# Patient Record
Sex: Female | Born: 1975 | Race: White | Hispanic: No | Marital: Married | Smoking: Never smoker
Health system: Southern US, Community
[De-identification: ages and names within clinical notes are randomized; demographics above are authoritative.]

## PROBLEM LIST (undated history)

## (undated) DIAGNOSIS — G43909 Migraine, unspecified, not intractable, without status migrainosus: Secondary | ICD-10-CM

## (undated) DIAGNOSIS — F419 Anxiety disorder, unspecified: Secondary | ICD-10-CM

## (undated) HISTORY — DX: Migraine, unspecified, not intractable, without status migrainosus: G43.909

## (undated) HISTORY — PX: ABDOMINAL HYSTERECTOMY: SHX81

## (undated) HISTORY — DX: Anxiety disorder, unspecified: F41.9

---

## 2004-09-11 ENCOUNTER — Observation Stay (HOSPITAL_COMMUNITY): Admission: RE | Admit: 2004-09-11 | Discharge: 2004-09-12 | Payer: Self-pay | Admitting: Gynecology

## 2004-09-11 ENCOUNTER — Encounter (INDEPENDENT_AMBULATORY_CARE_PROVIDER_SITE_OTHER): Payer: Self-pay | Admitting: Specialist

## 2005-02-06 ENCOUNTER — Emergency Department: Payer: Self-pay | Admitting: Emergency Medicine

## 2005-03-11 ENCOUNTER — Ambulatory Visit: Payer: Self-pay | Admitting: Ophthalmology

## 2005-03-25 ENCOUNTER — Ambulatory Visit: Payer: Self-pay | Admitting: Ophthalmology

## 2005-04-07 ENCOUNTER — Emergency Department: Payer: Self-pay | Admitting: General Practice

## 2005-05-05 ENCOUNTER — Emergency Department: Payer: Self-pay | Admitting: Emergency Medicine

## 2005-08-13 ENCOUNTER — Ambulatory Visit: Payer: Self-pay | Admitting: Gynecology

## 2006-03-14 ENCOUNTER — Ambulatory Visit: Payer: Self-pay | Admitting: Gynecology

## 2008-07-03 ENCOUNTER — Emergency Department: Payer: Self-pay | Admitting: Emergency Medicine

## 2008-07-04 ENCOUNTER — Emergency Department: Payer: Self-pay

## 2008-11-18 ENCOUNTER — Emergency Department: Payer: Self-pay | Admitting: Emergency Medicine

## 2009-05-20 ENCOUNTER — Ambulatory Visit: Payer: Self-pay | Admitting: Obstetrics and Gynecology

## 2009-05-20 ENCOUNTER — Other Ambulatory Visit: Admission: RE | Admit: 2009-05-20 | Discharge: 2009-05-20 | Payer: Self-pay | Admitting: Obstetrics and Gynecology

## 2009-05-20 LAB — CONVERTED CEMR LAB
ALT: 17 units/L (ref 0–35)
AST: 19 units/L (ref 0–37)
Albumin: 4.6 g/dL (ref 3.5–5.2)
Alkaline Phosphatase: 59 units/L (ref 39–117)
MCHC: 34.3 g/dL (ref 30.0–36.0)
Platelets: 282 10*3/uL (ref 150–400)
Potassium: 4.6 meq/L (ref 3.5–5.3)
RDW: 12.4 % (ref 11.5–15.5)
Sodium: 140 meq/L (ref 135–145)
Total Protein: 7.8 g/dL (ref 6.0–8.3)
Trich, Wet Prep: NONE SEEN

## 2009-10-30 ENCOUNTER — Ambulatory Visit: Payer: Self-pay | Admitting: Obstetrics & Gynecology

## 2009-10-31 ENCOUNTER — Encounter: Payer: Self-pay | Admitting: Obstetrics & Gynecology

## 2009-11-17 ENCOUNTER — Ambulatory Visit: Payer: Self-pay | Admitting: Obstetrics and Gynecology

## 2010-05-19 NOTE — Assessment & Plan Note (Signed)
NAMEHILLARIE, HARRIGAN NO.:  [id number]   MEDICAL RECORD NO.:  [id number]          PATIENT TYPE:  POB   LOCATION:  [facility] at [facility]         FACILITY:  [facility]   PHYSICIAN:  Argentina Donovan, MD        DATE OF BIRTH:  [birth year]   DATE OF SERVICE:  05/20/2009                                  CLINIC NOTE   The patient is a 35 year old Caucasian female gravida 3, para 3-0-0-3  who over a year ago lost her job when the company moved down the country  and she started going to [employer] school.  She has one more year left.  She is doing well, but she has some complaints of weight gain over the  past 2 years of 27 pounds.  Her current weight 162.  Intermittent  generalized joint pain, fatigue, and leg cramps at night.  She states  she does have some mild varicose veins, but mainly spider veins, has not  had any blood work in some time.   Review of systems are as above.   The patient has had a total vaginal hysterectomy back in 2006.   PHYSICAL EXAMINATION:  VITAL SIGNS:  Her blood pressure is 128/90, pulse  81 per minute, her weight is 162, and she is 5 feet 6 inches tall.  GENERAL:  Well-developed, well-nourished white female in no acute  distress.  HEENT:  PERRLA, normocephalic.  NECK:  Supple.  Thyroid, symmetrical.  No dominant mass.  BACK:  Erect.  LUNGS:  Clear to auscultation and percussion.  HEART:  No murmur.  Normal sinus rhythm.  BREASTS:  Symmetrical.  No dominant masses.  No nipple discharge.  No  supraclavicular or axillary nodes.  ABDOMEN:  Soft, flat, nontender.  No masses.  No organomegaly.  EXTREMITIES:  Multiple areas of spider veins, but no large varicosities  noted.  No edema.  NEUROLOGIC:  DTRs within normal limits.  MUSCULOSKELETAL:  Joints, no sign of any chronic joint disease on  physical examination.  PELVIC:  Genitalia, external, normal.  BUS within normal limits.  Vagina  is clean and well rugated.  The vagina is status hysterectomy with  heavy  white discharge.  Wet prep was taken.  Bimanual pelvic examination  failed to be able to outline the ovaries, but to the apex of vagina felt  free.  No masses palpable within the cavity.   IMPRESSION AND PLAN:  Normal physical examination.  CMET and TSH.  Fatigue, recurrent joint pain, leg cramps nightly.           ______________________________  Argentina Donovan, MD     PR/MEDQ  D:  05/20/2009  T:  05/21/2009  Job:  [id number]

## 2010-05-19 NOTE — Assessment & Plan Note (Signed)
Brenda Park, Brenda Park             ACCOUNT NO.:  [id number]   MEDICAL RECORD NO.:  [id number]          PATIENT TYPE:  POB   LOCATION:  [facility] at [facility]         FACILITY:  [facility]   PHYSICIAN:  Jaynie Collins, MD     DATE OF BIRTH:  1975-09-24   DATE OF SERVICE:  11/17/2009                                  CLINIC NOTE   REASON FOR VISIT:  Pelvic pain.   The patient is a 35 year old, gravida 4, para 3-0-1-3 who was last seen  in October 30, 2009, complaining of low back pain and vaginal itching  with discharge.  A UA that was done at the nurse visit was remarkable  for trace blood, trace leukocyte and she was presumptively treated with  Bactrim double strength twice a day for 3 days and also a Diflucan  medicine for presumptive yeast infection.  The patient does say that the  yeast infection or the vaginal discharge has gotten better.  However,  her pelvic pain and lower back pain has gotten worse.  She describes it  has been on her left lower quadrant radiating to the left flank area.  She says that the pain is sharp and intense and sometimes makes it hard  for her to sleep at night.  She sometimes says that this pain is  accompanied by some pressure.  The patient is very concerned about this  coming from her left ovary.  Of note, she did have a total vaginal  hysterectomy back in 2006.  The patient does want evaluation for this  pain.  The indication for her hysterectomy was menometrorrhagia.   PHYSICAL EXAMINATION:  VITAL SIGNS:  Blood pressure was 152/93, pulse  68, weight 168 pounds, height 5 feet 6 inches.  GENERAL:  No apparent distress.  ABDOMEN:  Soft.  Mild tenderness to palpation in the left lower  quadrant.  No rebound or guarding.  No suprapubic tenderness.  Mild left  upper quadrant and flank tenderness.  No tenderness on her right side.  No organomegaly.  PELVIC:  Normal external female genitalia.  Pink and well rugated  vagina.  The patient does have a significant  amount of thick white  discharge and the sample was taken for wet prep.  No lesions seen on  bimanual exam.  Adnexa were unable to be palpated, but there was no  tenderness on examination.  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   Urine culture that was done on October 30, 2009, came back negative.   IMPRESSION:  Pelvic pain of unknown etiology.   PLAN:  The patient was told that given the pattern of her pain is very  unlikely that it is from her left ovary, but a pelvic ultrasound will be  done just to rule out any structural anomalies.  Her pain could be  musculoskeletal in origin versus being of gastrointestinal origin.  She  was told that we will follow up with the results of the pelvic  ultrasound and manage accordingly.  We will also follow up with the  results of her wet prep for now.  She was given a prescription for  diclofenac DR 75 kg p.o. b.i.d. p.r.n.  pain and she will come back for  the results of her pelvic  ultrasound.  We will also continue to keep an eye on her blood pressure  as of now and it is possible that it is elevated today secondary to her  pain and she has not had a history of high blood pressure in the past.           ______________________________  Jaynie Collins, MD     UA/MEDQ  D:  11/17/2009  T:  11/18/2009  Job:  [id number]

## 2010-05-22 NOTE — Op Note (Signed)
[name], Brenda Park             ACCOUNT NO.:  [id number]   MEDICAL RECORD NO.:  [id number]          PATIENT TYPE:  OBV   LOCATION:  9399                          FACILITY:  [facility]   PHYSICIAN:  Ginger Carne, MD  DATE OF BIRTH:  July 04, 1975   DATE OF PROCEDURE:  09/11/2004  DATE OF DISCHARGE:                                 OPERATIVE REPORT   PREOPERATIVE DIAGNOSIS:  Menometrorrhagia.   POSTOPERATIVE DIAGNOSIS:  Menometrorrhagia.   PROCEDURE:  Total vaginal hysterectomy with preservation of both tubes and  ovaries.   SURGEON:  Ginger Carne, M.D.   ASSISTANT:  None.   COMPLICATIONS:  None immediate.   ESTIMATED BLOOD LOSS:  Less than 75 mL.   SPECIMEN:  Uterus and cervix.   OPERATIVE FINDINGS:  Uterus about 8 weeks in size, globular. Both tubes and  ovaries bilaterally appeared normal.   OPERATIVE PROCEDURE:  The patient prepped and draped in usual fashion and  placed in lithotomy position. Betadine solution used for antiseptic and the  patient was catheterized prior to procedure. After adequate general  anesthesia a double tooth tenaculum placed on the anterior and posterior  leaves of the cervix. 2 cm of anterior and posterior vaginal epithelium were  incised transversely and following this the posterior and anterior  peritoneal reflections were identified and opened without injury to their  respective organs. Afterwards the uterosacral cardinal ligament complexes  were clamped, cut, and ligated with 0 Vicryl suture. This was then followed  by clamp, cut and ligation of the uterine vasculature with its ascending  branches. The round ligaments were taken separately and the utero-ovarian  ligaments clamped, cut and ligated with transfixation 0 Vicryl sutures.  Bleeding points hemostatically checked. Blood clots removed from the  abdomen. Closure of the cuff in one layer of 0 Vicryl running interlocking  suture. Urine clear at the end of the procedure. The patient  tolerated  procedure well and returned to post anesthesia recovery room in excellent  condition.      Ginger Carne, MD  Electronically Signed     SHB/MEDQ  D:  09/11/2004  T:  09/11/2004  Job:  [id number]

## 2010-05-22 NOTE — H&P (Signed)
Brenda Park, Brenda Park             ACCOUNT NO.:  [id number]   MEDICAL RECORD NO.:  [id number]          PATIENT TYPE:  AMB   LOCATION:  [facility]                           FACILITY:  [facility]   PHYSICIAN:  Ginger Carne, MD  DATE OF BIRTH:  Jun 07, 1975   DATE OF ADMISSION:  DATE OF DISCHARGE:                                HISTORY & PHYSICAL   PROCEDURE:  Total vaginal hysterectomy with preservation of both tubes and  ovaries.   REASON FOR HOSPITALIZATION:  Adenomyosis, menometrorrhagia.   HISTORY OF PRESENT ILLNESS:  This patient is a 35 year old, gravida 4, para  3-0-1-3, Caucasian female admitted for a total vaginal hysterectomy with  preservation of both tubes and ovaries because of menometrorrhagia.  The  patient's menses are anywhere between 8-14 days in length, 28-30 days apart.  In the past, the patient has had two different types of intrauterine devices  to control bleeding (ParaGard and Tubac without benefit).  She has been  unable to utilize several different forms of oral contraceptives in the past  because of nausea, mood swings, and headaches.  The last oral contraceptive  agent (Estrostep) caused similar symptoms.  Provera 30 mg daily during the  course of her menses and Norisodrine acetate 5 mg have been used with  minimal benefit.  The patient has significant cramping associated with said  bleeding.  She takes no medications to enhance her bleeding propensity and  has no personal or family history of bleeding disorders.  Transvaginal  ultrasound reveals no evidence for fibroids.  SIS demonstrates no evidence  for intracavitary lesions.  The posterior aspect of the uterus is enlarged  suggestive of adenomyosis.  An MRI was deferred due to cost.  Endometrial  biopsy reveals no evidence of neoplasia or hyperplasia.  The patient has  also attempted the use of Prometrium without benefit to control her menses.   OBSTETRICAL/GYNECOLOGIC HISTORY:  The patient has had one  termination in the  past and three vaginal deliveries between 1991 and 2001.   ALLERGIES:  None.   CURRENT MEDICATIONS:  None.   MEDICAL HISTORY:  Negative.   SURGICAL HISTORY:  Negative.   SOCIAL HISTORY:  The patient is a nonsmoker.  Denies illicit drug abuse or  alcohol abuse.   FAMILY HISTORY:  Noncontributory.   REVIEW OF SYSTEMS:  Noncontributory.   PHYSICAL EXAMINATION:  VITAL SIGNS:  Height 5 foot 5 inches, weight 143  pounds, blood pressure 122/78.  HEENT:  Grossly normal.  BREAST:  Without masses, discharge, thickening, or tenderness bilaterally.  CHEST:  Clear to percussion auscultation.  CARDIOVASCULAR:  Without murmurs or enlargements.  Regular rate and rhythm.  VASCULAR:  Within normal limits.  EXTREMITIES:  Within normal limits.  LYMPHATICS:  Within normal limits.  SKIN:  Within normal limits.  NEUROLOGIC:  Within normal limits.  ABDOMEN:  Soft without gross hepatosplenomegaly.  PELVIC:  External genitalia, vulva, and vagina normal.  Cervix smooth  without erosions or lesions.  Uterus is upper limits of normal size.  Both  adnexa are palpable and found to be normal.  RECTAL:  Hemoccult negative without masses.  Pap smear is normal.   IMPRESSION:  Menometrorrhagia.   PLAN:  Total vaginal hysterectomy.  The patient is unable to tolerate  hormonal management for her abnormal bleeding.  The bleeding has had a  negative impact on her quality of life.  She has no desire for further  childbearing and understands that said hysterectomy will render her sterile.  She has no correctable anatomic or endocrine factors. Risks including  injuries to ureter, bowel, and bladder, possible conversion to laparoscopic  or open procedure, hemorrhage possibly requiring a blood transfusion,  infection, postoperative pulmonary complications and other unforeseen  complications were discussed with the patient.  All questions answered to  the satisfaction of said patient and all  instructions understood.      Ginger Carne, MD  Electronically Signed     SHB/MEDQ  D:  09/09/2004  T:  09/09/2004  Job:  [id number]

## 2010-05-22 NOTE — Discharge Summary (Signed)
[name], Brenda Park             ACCOUNT NO.:  [id number]   MEDICAL RECORD NO.:  [id number]          PATIENT TYPE:  OBV   LOCATION:  9303                          FACILITY:  [facility]   PHYSICIAN:  Ginger Carne, MD  DATE OF BIRTH:  1975/04/30   DATE OF ADMISSION:  09/11/2004  DATE OF DISCHARGE:  09/12/2004                                 DISCHARGE SUMMARY   REASON FOR HOSPITALIZATION:  Menometrorrhagia.   INHOSPITAL PROCEDURES:  Total vaginal hysterectomy with preservation of both  tubes and ovaries.   FINAL DIAGNOSIS:  Menometrorrhagia.   INHOSPITAL COURSE:  This is a 35 year old multiparous Caucasian female who  underwent the aforementioned procedures for menometrorrhagia on September 11, 2004.  Intraoperative course was uneventful.  Postoperatively she was  afebrile, voided well, incision dry, calves without tenderness, abdomen  soft, lungs clear, minimal vaginal drainage.  Postoperative hemoglobin 10.8  and hematocrit 31.6.  Patient was given routine postoperative instructions  including contacting the office for temperature elevation above 100.4  degrees Fahrenheit, increasing vaginal drainage, abdominal pain,  constipation or difficulty voiding.  Patient will be seen back in 4 weeks.  Dilaudid 1-2 mg every 4-6 hours prescribed to said patient.      Ginger Carne, MD  Electronically Signed     SHB/MEDQ  D:  09/12/2004  T:  09/12/2004  Job:  [id number]

## 2011-08-11 ENCOUNTER — Ambulatory Visit: Payer: Self-pay | Admitting: Obstetrics & Gynecology

## 2011-08-11 DIAGNOSIS — Z01419 Encounter for gynecological examination (general) (routine) without abnormal findings: Secondary | ICD-10-CM

## 2012-01-07 ENCOUNTER — Ambulatory Visit: Payer: Self-pay | Admitting: Obstetrics and Gynecology

## 2013-04-12 IMAGING — US ULTRASOUND RIGHT BREAST
1 series · 4 of 4 positions shown · non-contrast
Comparison: none

REASON FOR EXAM: av rt lobulated nodule OUTSIDE
COMMENTS:

PROCEDURE:     US  - US BREAST RIGHT  - January 07, 2012  [DATE]
RESULT:     Focus right breast ultrasound dated 01/07/2012.

[Series 1: ultrasound right breast · 0.08mm/px · 4 of 4 slices shown]
[im 1/4]
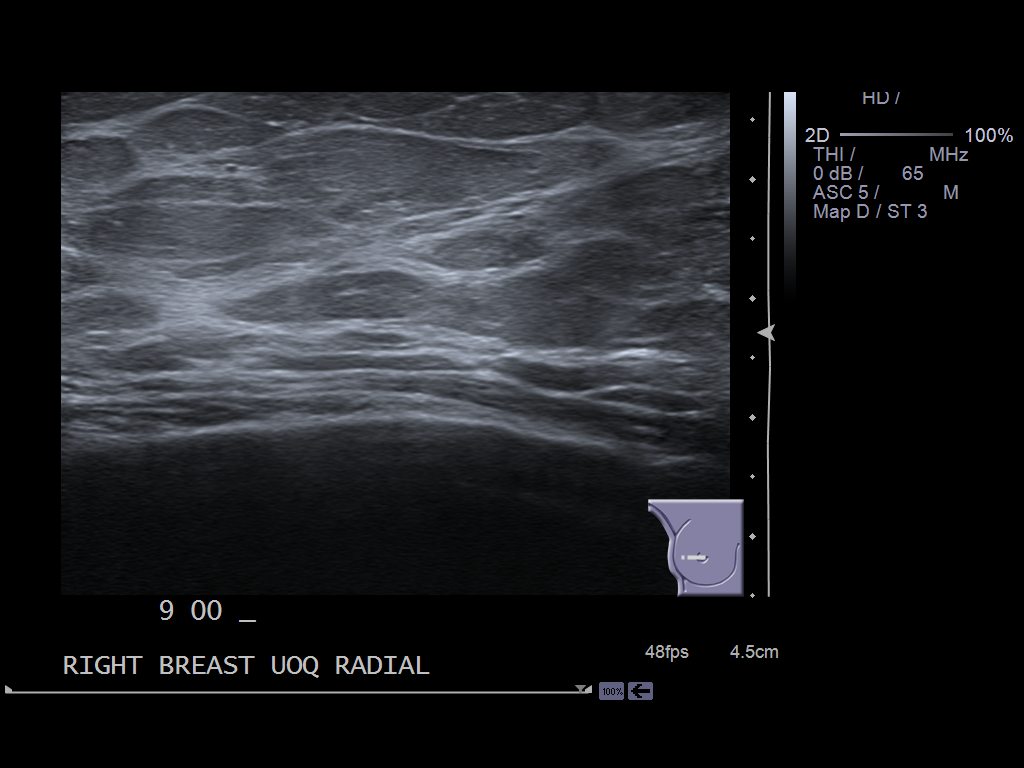
[im 2/4]
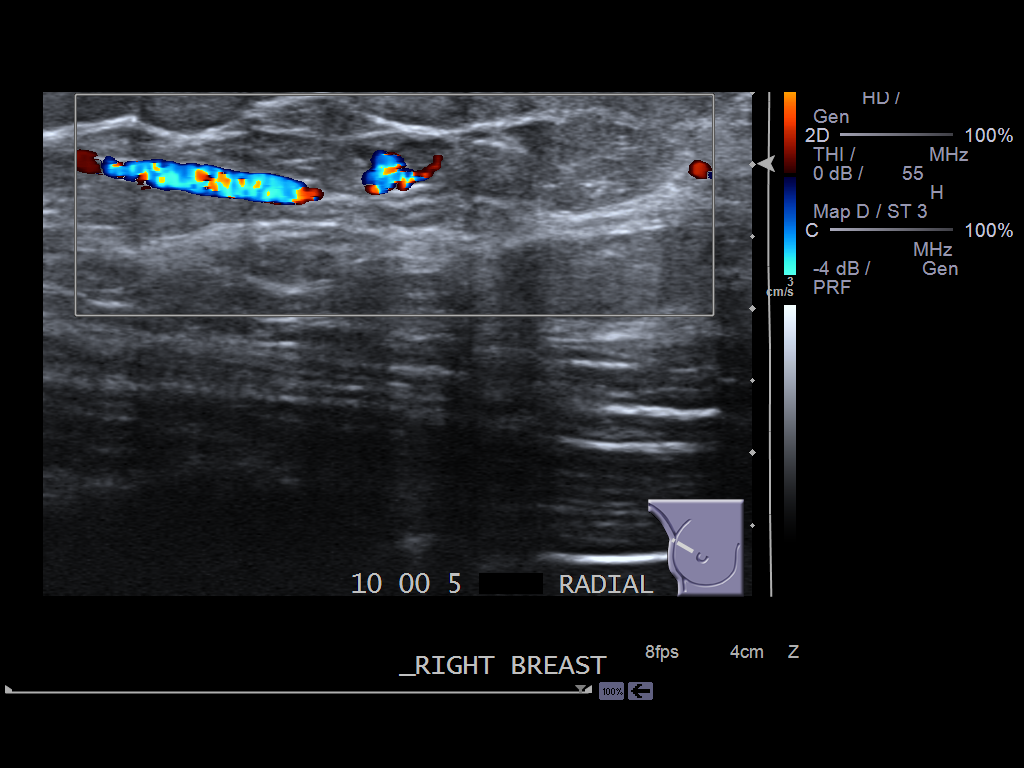
[im 3/4]
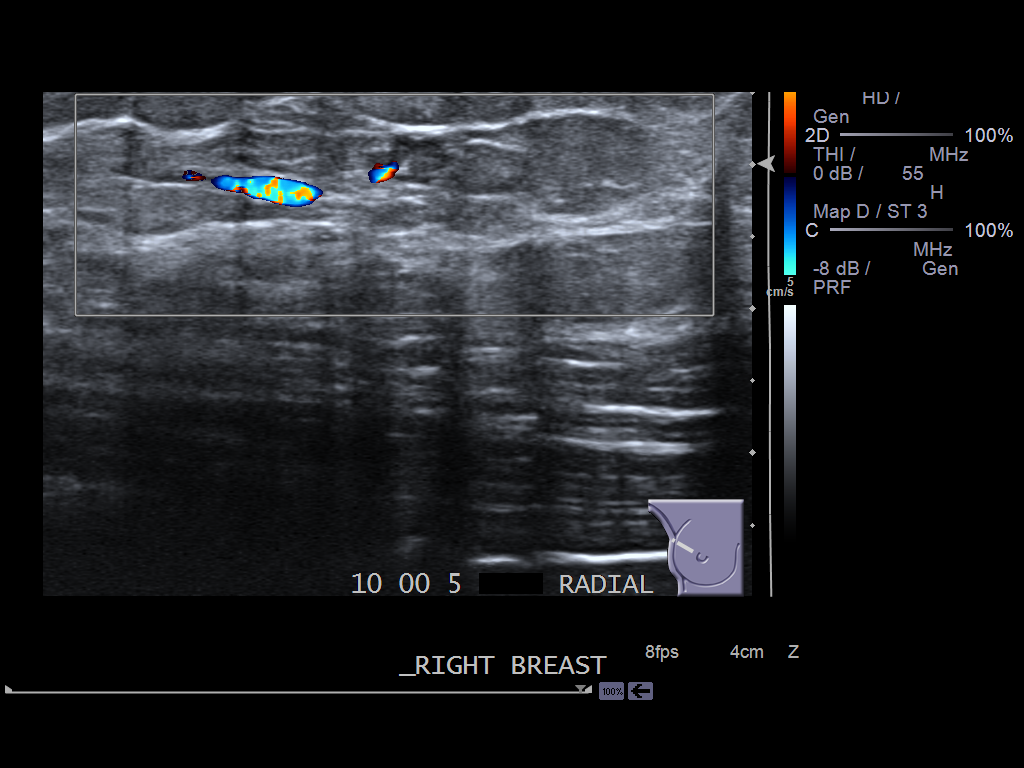
[im 4/4]
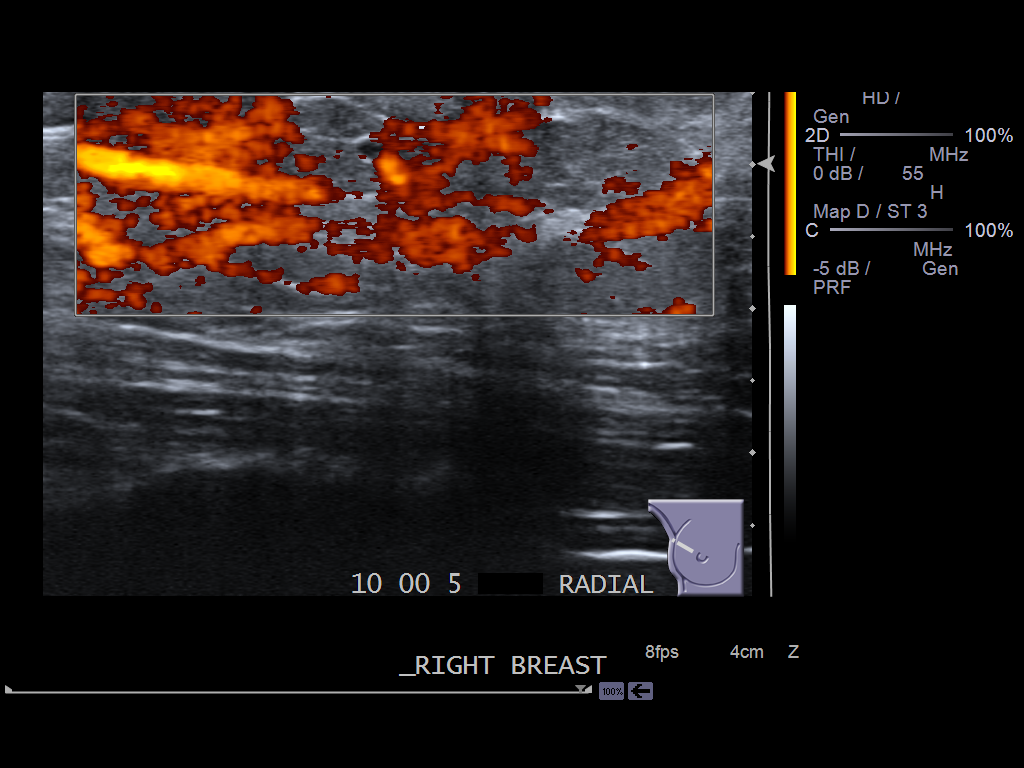

[4 of 4 positions shown; findings below may reference images not displayed]

FINDINGS: The right breast was evaluated in the region of interest in the
upper outer quadrant. At the [DATE] position about 5 cm from the nipple
complex appearing hypoechoic nodule is appreciated measuring 0.67 x
centimeters. There are findings suggesting eccentric vascularity along the
focal periphery of the nodule. The sonographic findings are indeterminate
though a lymph node cannot be excluded. No further solid or cystic
sonographic anomalies identified.
IMPRESSION: Indeterminate nodule in the region of interest of the right
breast please refer to the additional radiographic view dictation for
complete discussion.

## 2017-02-23 ENCOUNTER — Other Ambulatory Visit: Payer: Self-pay | Admitting: Student

## 2017-02-23 DIAGNOSIS — Z1239 Encounter for other screening for malignant neoplasm of breast: Secondary | ICD-10-CM

## 2017-04-04 DIAGNOSIS — G43009 Migraine without aura, not intractable, without status migrainosus: Secondary | ICD-10-CM | POA: Insufficient documentation

## 2017-04-04 DIAGNOSIS — F419 Anxiety disorder, unspecified: Secondary | ICD-10-CM | POA: Insufficient documentation

## 2017-04-04 DIAGNOSIS — I10 Essential (primary) hypertension: Secondary | ICD-10-CM | POA: Insufficient documentation

## 2018-03-10 ENCOUNTER — Ambulatory Visit: Payer: Self-pay | Admitting: Advanced Practice Midwife

## 2018-03-15 ENCOUNTER — Ambulatory Visit: Payer: Self-pay | Admitting: Advanced Practice Midwife

## 2018-03-20 ENCOUNTER — Ambulatory Visit: Payer: Self-pay | Admitting: Advanced Practice Midwife

## 2018-04-05 ENCOUNTER — Ambulatory Visit: Payer: Self-pay | Admitting: Advanced Practice Midwife

## 2018-05-05 ENCOUNTER — Ambulatory Visit: Payer: Self-pay | Admitting: Advanced Practice Midwife

## 2018-06-27 ENCOUNTER — Ambulatory Visit: Payer: Self-pay | Admitting: Advanced Practice Midwife

## 2018-08-04 DIAGNOSIS — M545 Low back pain, unspecified: Secondary | ICD-10-CM | POA: Insufficient documentation

## 2018-09-08 ENCOUNTER — Ambulatory Visit: Payer: Self-pay | Admitting: Advanced Practice Midwife

## 2018-09-22 ENCOUNTER — Ambulatory Visit: Payer: Self-pay | Admitting: Advanced Practice Midwife

## 2018-10-06 ENCOUNTER — Other Ambulatory Visit: Payer: Self-pay

## 2018-10-06 ENCOUNTER — Encounter: Payer: Self-pay | Admitting: Advanced Practice Midwife

## 2018-10-06 ENCOUNTER — Ambulatory Visit (INDEPENDENT_AMBULATORY_CARE_PROVIDER_SITE_OTHER): Payer: BC Managed Care – PPO | Admitting: Advanced Practice Midwife

## 2018-10-06 ENCOUNTER — Other Ambulatory Visit (HOSPITAL_COMMUNITY)
Admission: RE | Admit: 2018-10-06 | Discharge: 2018-10-06 | Disposition: A | Discharge status: Home / Self Care | Payer: BC Managed Care – PPO | Source: Ambulatory Visit | Attending: Advanced Practice Midwife | Admitting: Advanced Practice Midwife

## 2018-10-06 VITALS — BP 112/70 | HR 50 | Ht 66.0 in | Wt 183.0 lb

## 2018-10-06 DIAGNOSIS — Z124 Encounter for screening for malignant neoplasm of cervix: Secondary | ICD-10-CM | POA: Diagnosis present

## 2018-10-06 DIAGNOSIS — Z01419 Encounter for gynecological examination (general) (routine) without abnormal findings: Secondary | ICD-10-CM | POA: Diagnosis not present

## 2018-10-06 DIAGNOSIS — Z1211 Encounter for screening for malignant neoplasm of colon: Secondary | ICD-10-CM

## 2018-10-06 DIAGNOSIS — Z1239 Encounter for other screening for malignant neoplasm of breast: Secondary | ICD-10-CM

## 2018-10-06 NOTE — Patient Instructions (Signed)
Health Maintenance, Female Adopting a healthy lifestyle and getting preventive care are important in promoting health and wellness. Ask your health care provider about:  The right schedule for you to have regular tests and exams.  Things you can do on your own to prevent diseases and keep yourself healthy. What should I know about diet, weight, and exercise? Eat a healthy diet   Eat a diet that includes plenty of vegetables, fruits, low-fat dairy products, and lean protein.  Do not eat a lot of foods that are high in solid fats, added sugars, or sodium. Maintain a healthy weight Body mass index (BMI) is used to identify weight problems. It estimates body fat based on height and weight. Your health care provider can help determine your BMI and help you achieve or maintain a healthy weight. Get regular exercise Get regular exercise. This is one of the most important things you can do for your health. Most adults should:  Exercise for at least 150 minutes each week. The exercise should increase your heart rate and make you sweat (moderate-intensity exercise).  Do strengthening exercises at least twice a week. This is in addition to the moderate-intensity exercise.  Spend less time sitting. Even light physical activity can be beneficial. Watch cholesterol and blood lipids Have your blood tested for lipids and cholesterol at 45 years of age, then have this test every 5 years. Have your cholesterol levels checked more often if:  Your lipid or cholesterol levels are high.  You are older than 45 years of age.  You are at high risk for heart disease. What should I know about cancer screening? Depending on your health history and family history, you may need to have cancer screening at various ages. This may include screening for:  Breast cancer.  Cervical cancer.  Colorectal cancer.  Skin cancer.  Lung cancer. What should I know about heart disease, diabetes, and high blood  pressure? Blood pressure and heart disease  High blood pressure causes heart disease and increases the risk of stroke. This is more likely to develop in people who have high blood pressure readings, are of African descent, or are overweight.  Have your blood pressure checked: ? Every 3-5 years if you are 18-39 years of age. ? Every year if you are 40 years old or older. Diabetes Have regular diabetes screenings. This checks your fasting blood sugar level. Have the screening done:  Once every three years after age 40 if you are at a normal weight and have a low risk for diabetes.  More often and at a younger age if you are overweight or have a high risk for diabetes. What should I know about preventing infection? Hepatitis B If you have a higher risk for hepatitis B, you should be screened for this virus. Talk with your health care provider to find out if you are at risk for hepatitis B infection. Hepatitis C Testing is recommended for:  Everyone born from 1945 through 1965.  Anyone with known risk factors for hepatitis C. Sexually transmitted infections (STIs)  Get screened for STIs, including gonorrhea and chlamydia, if: ? You are sexually active and are younger than 45 years of age. ? You are older than 45 years of age and your health care provider tells you that you are at risk for this type of infection. ? Your sexual activity has changed since you were last screened, and you are at increased risk for chlamydia or gonorrhea. Ask your health care provider if   you are at risk.  Ask your health care provider about whether you are at high risk for HIV. Your health care provider may recommend a prescription medicine to help prevent HIV infection. If you choose to take medicine to prevent HIV, you should first get tested for HIV. You should then be tested every 3 months for as long as you are taking the medicine. Pregnancy  If you are about to stop having your period (premenopausal) and  you may become pregnant, seek counseling before you get pregnant.  Take 400 to 800 micrograms (mcg) of folic acid every day if you become pregnant.  Ask for birth control (contraception) if you want to prevent pregnancy. Osteoporosis and menopause Osteoporosis is a disease in which the bones lose minerals and strength with aging. This can result in bone fractures. If you are 65 years old or older, or if you are at risk for osteoporosis and fractures, ask your health care provider if you should:  Be screened for bone loss.  Take a calcium or vitamin D supplement to lower your risk of fractures.  Be given hormone replacement therapy (HRT) to treat symptoms of menopause. Follow these instructions at home: Lifestyle  Do not use any products that contain nicotine or tobacco, such as cigarettes, e-cigarettes, and chewing tobacco. If you need help quitting, ask your health care provider.  Do not use street drugs.  Do not share needles.  Ask your health care provider for help if you need support or information about quitting drugs. Alcohol use  Do not drink alcohol if: ? Your health care provider tells you not to drink. ? You are pregnant, may be pregnant, or are planning to become pregnant.  If you drink alcohol: ? Limit how much you use to 0-1 drink a day. ? Limit intake if you are breastfeeding.  Be aware of how much alcohol is in your drink. In the [place] one drink equals one 12 oz bottle of beer (355 mL), one 5 oz glass of wine (148 mL), or one 1 oz glass of hard liquor (44 mL). General instructions  Schedule regular health, dental, and eye exams.  Stay current with your vaccines.  Tell your health care provider if: ? You often feel depressed. ? You have ever been abused or do not feel safe at home. Summary  Adopting a healthy lifestyle and getting preventive care are important in promoting health and wellness.  Follow your health care provider's instructions about healthy  diet, exercising, and getting tested or screened for diseases.  Follow your health care provider's instructions on monitoring your cholesterol and blood pressure. This information is not intended to replace advice given to you by your health care provider. Make sure you discuss any questions you have with your health care provider. Document Released: 07/06/2010 Document Revised: 12/14/2017 Document Reviewed: 12/14/2017 Elsevier Patient Education  2020 [employer]  

## 2018-10-08 ENCOUNTER — Encounter: Payer: Self-pay | Admitting: Advanced Practice Midwife

## 2018-10-08 NOTE — Progress Notes (Signed)
Gynecology Annual Exam  PCP: Patient, No Pcp Per  Chief Complaint:  Chief Complaint  Patient presents with  . Gynecologic Exam    vaginal pain/ breast pain    History of Present Illness: Patient is a 43 y.o. [id number] presents for annual exam. The patient has no gyn complaints today. A few weeks ago she had some vaginal pain and left breast or chest pain. Those symptoms have resolved. She was seen for the breast/chest pain and it was suggested that the cause could have been anxiety. She admits ongoing anxiety and her current medication is adequate.   LMP: No LMP recorded. Patient has had a hysterectomy.  Postcoital Bleeding: no Dysmenorrhea: not applicable   The patient is sexually active. She currently uses status post hysterectomy for contraception. She denies dyspareunia.  The patient does perform self breast exams.  There is no notable family history of breast or ovarian cancer in her family.  The patient wears seatbelts: yes.   The patient has regular exercise: she does not have regular exercise. She tries to eat healthy and admits adequate hydration and sleep. 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    The patient denies current symptoms of depression.    Review of Systems: Review of Systems  Constitutional: Negative.   HENT: Negative.   Eyes: Negative.   Respiratory: Negative.   Cardiovascular: Negative.   Gastrointestinal: Negative.   Genitourinary: Negative.   Musculoskeletal: Negative.   Skin: Negative.   Neurological: Negative.   Endo/Heme/Allergies: Negative.   Psychiatric/Behavioral: Negative.     Past Medical History:  Past Medical History:  Diagnosis Date  . Anxiety   . Migraines     Past Surgical History:  Past Surgical History:  Procedure Laterality Date  . ABDOMINAL HYSTERECTOMY     Partial (has ovaries)    Gynecologic History:  No LMP recorded. Patient has had a hysterectomy. Contraception: status post hysterectomy (uterus/cervix) 2005  Last Pap: 3-4 years ago Results were:  no abnormalities  Last mammogram: 6 years ago Results were: BI-RADS IV, biopsy normal  Obstetric History: [id number]  Family History:  Family History  Problem Relation Age of Onset  . Breast cancer Maternal Grandmother [id number]    Social History:  Social History   Socioeconomic History  . Marital status: Single    Spouse name: Not on file  . Number of children: Not on file  . Years of education: Not on file  . Highest education level: Not on file  Occupational History  . Occupation: [employer]    Employer: [employer]  Social Needs  . Financial resource strain: Not on file  . Food insecurity    Worry: Not on file    Inability: Not on file  . Transportation needs    Medical: Not on file    Non-medical: Not on file  Tobacco Use  . Smoking status: Never Smoker  . Smokeless tobacco: Never Used  Substance and Sexual Activity  . Alcohol use: Yes    Comment: Occ  . Drug use: Never  . Sexual activity: Yes    Birth control/protection: Surgical    Comment: Hysterectomy  Lifestyle  . Physical activity    Days per week: Not on file    Minutes per session: Not on file  . Stress: Not on file  Relationships  . Social Musicianconnections    Talks on phone: Not on file    Gets together: Not on file    Attends religious service: Not on  file    Active member of club or organization: Not on file    Attends meetings of clubs or organizations: Not on file    Relationship status: Not on file  . Intimate partner violence    Fear of current or ex partner: Not on file    Emotionally abused: Not on file    Physically abused: Not on file    Forced sexual activity: Not on file  Other Topics Concern  . Not on file  Social History Narrative  . Not on file    Allergies:  No Known Allergies  Medications: Prior to Admission medications   Medication Sig Start Date End Date Taking? Authorizing Provider  busPIRone (BUSPAR) 15 MG tablet  09/15/18  Yes [provider]  busPIRone (BUSPAR) 7.5 MG tablet  06/25/18  Yes [provider]  losartan (COZAAR) 100 MG tablet  10/05/18  Yes [provider]  meloxicam (MOBIC) 15 MG tablet TK 1 T PO QD 08/04/18  Yes [provider]  traMADol (ULTRAM) 50 MG tablet Take by mouth.   Yes [provider]    Physical Exam Vitals: Blood pressure 112/70, pulse (!) 50, height 5\' 6"  (1.676 m), weight 183 lb (83 kg).  General: NAD HEENT: normocephalic, anicteric Thyroid: no enlargement, no palpable nodules Pulmonary: No increased work of breathing, CTAB Cardiovascular: RRR, distal pulses 2+ Breast: Breast symmetrical, no tenderness, no palpable nodules or masses, no skin or nipple retraction present, no nipple discharge.  No axillary or supraclavicular lymphadenopathy. Abdomen: NABS, soft, non-tender, non-distended.  Umbilicus without lesions.  No hepatomegaly, splenomegaly or masses palpable. No evidence of hernia  Genitourinary:  External: Normal external female genitalia.  Normal urethral meatus, normal Bartholin's and Skene's glands.    Vagina: Normal vaginal mucosa, no evidence of prolapse.    Cervix: not present- vaginal PAP specimen collected  Uterus: not present   Adnexa: ovaries non-enlarged, no adnexal masses  Rectal: deferred  Lymphatic: no evidence of inguinal lymphadenopathy Extremities: no edema, erythema, or tenderness Neurologic: Grossly intact Psychiatric: mood appropriate, affect full    Assessment: 43 y.o. G4P0013 routine annual exam  Plan: Problem List Items Addressed This Visit    None    Visit Diagnoses    Well woman exam with routine gynecological exam    -  Primary   Relevant Orders   Cytology - PAP   Cervical cancer screening       Relevant Orders   Cytology - PAP      1) Mammogram - recommend yearly screening mammogram.  Mammogram Was ordered today   2) STI screening  was offered and declined  3) ASCCP guidelines and rationale  discussed.  Patient opts for as needed with any change in sexual partner- previous hysterectomy including cervix screening interval  4) Contraception - the patient is currently using  status post hysterectomy.  She is happy with her current form of contraception and plans to continue  5) Colonoscopy: GI referral sent today -- Screening recommended starting at age 34 for average risk individuals, age 51 for individuals deemed at increased risk (including African Americans) and recommended to continue until age 44.  For patient age 57-85 individualized approach is recommended.  Gold standard screening is via colonoscopy, Cologuard screening is an acceptable alternative for patient unwilling or unable to undergo colonoscopy.  "Colorectal cancer screening for average?risk adults: 2018 guideline update from the American Cancer Society"CA: A Cancer Journal for Clinicians: Jun 02, 2016   6) Routine healthcare maintenance  including cholesterol, diabetes screening discussed Declines  7) Return in about 1 year (around 10/06/2019) for annual established gyn.   Rod Can, [facility] 10/08/2018, 12:45 PM

## 2018-10-09 ENCOUNTER — Telehealth: Payer: Self-pay

## 2018-10-09 NOTE — Telephone Encounter (Signed)
Gastroenterology Pre-Procedure Review  Request Date: (PENDING-pt being tested for COVID Today or Tomorrow) Requesting Physician: Dr. (PENDING)  PATIENT REVIEW QUESTIONS: The patient responded to the following health history questions as indicated:    1. Are you having any GI issues? yes (Acid Reflux declined office visit to discuss) 2. Do you have a personal history of Polyps? no 3. Do you have a family history of Colon Cancer or Polyps? no 4. Diabetes Mellitus? no 5. Joint replacements in the past 12 months?no 6. Major health problems in the past 3 months?no 7. Any artificial heart valves, MVP, or defibrillator?no    MEDICATIONS & ALLERGIES:    Patient reports the following regarding taking any anticoagulation/antiplatelet therapy:   Plavix, Coumadin, Eliquis, Xarelto, Lovenox, Pradaxa, Brilinta, or Effient? no Aspirin? no  Patient confirms/reports the following medications:  Current Outpatient Medications  Medication Sig Dispense Refill  . busPIRone (BUSPAR) 15 MG tablet     . busPIRone (BUSPAR) 7.5 MG tablet     . losartan (COZAAR) 100 MG tablet     . meloxicam (MOBIC) 15 MG tablet TK 1 T PO QD    . traMADol (ULTRAM) 50 MG tablet Take by mouth.     No current facility-administered medications for this visit.     Patient confirms/reports the following allergies:  No Known Allergies  No orders of the defined types were placed in this encounter.   AUTHORIZATION INFORMATION Primary Insurance: 1D#: Group #:  Secondary Insurance: 1D#: Group #:  SCHEDULE INFORMATION: Date: PENDING-will call pt back to schedule due to COVID Testing this week Time: Location:

## 2018-10-13 LAB — CYTOLOGY - PAP
Diagnosis: NEGATIVE
High risk HPV: NEGATIVE

## 2018-10-24 ENCOUNTER — Encounter: Payer: Self-pay | Admitting: *Deleted

## 2019-01-22 DIAGNOSIS — M5416 Radiculopathy, lumbar region: Secondary | ICD-10-CM | POA: Insufficient documentation

## 2019-01-22 DIAGNOSIS — M5136 Other intervertebral disc degeneration, lumbar region: Secondary | ICD-10-CM | POA: Insufficient documentation

## 2019-01-22 DIAGNOSIS — M51369 Other intervertebral disc degeneration, lumbar region without mention of lumbar back pain or lower extremity pain: Secondary | ICD-10-CM | POA: Insufficient documentation

## 2019-10-02 DIAGNOSIS — F411 Generalized anxiety disorder: Secondary | ICD-10-CM | POA: Diagnosis not present

## 2019-10-09 DIAGNOSIS — Z20822 Contact with and (suspected) exposure to covid-19: Secondary | ICD-10-CM | POA: Diagnosis not present

## 2019-12-20 DIAGNOSIS — I83893 Varicose veins of bilateral lower extremities with other complications: Secondary | ICD-10-CM | POA: Diagnosis not present

## 2020-01-16 DIAGNOSIS — R059 Cough, unspecified: Secondary | ICD-10-CM | POA: Diagnosis not present

## 2020-01-16 DIAGNOSIS — U071 COVID-19: Secondary | ICD-10-CM | POA: Diagnosis not present

## 2020-04-14 DIAGNOSIS — R200 Anesthesia of skin: Secondary | ICD-10-CM | POA: Diagnosis not present

## 2020-04-14 DIAGNOSIS — G4719 Other hypersomnia: Secondary | ICD-10-CM | POA: Diagnosis not present

## 2020-04-14 DIAGNOSIS — M2550 Pain in unspecified joint: Secondary | ICD-10-CM | POA: Diagnosis not present

## 2020-04-14 DIAGNOSIS — R5383 Other fatigue: Secondary | ICD-10-CM | POA: Diagnosis not present

## 2020-04-23 DIAGNOSIS — G5603 Carpal tunnel syndrome, bilateral upper limbs: Secondary | ICD-10-CM | POA: Diagnosis not present

## 2020-05-15 DIAGNOSIS — G5603 Carpal tunnel syndrome, bilateral upper limbs: Secondary | ICD-10-CM | POA: Diagnosis not present

## 2020-06-24 DIAGNOSIS — J00 Acute nasopharyngitis [common cold]: Secondary | ICD-10-CM | POA: Diagnosis not present

## 2020-07-08 DIAGNOSIS — J069 Acute upper respiratory infection, unspecified: Secondary | ICD-10-CM | POA: Diagnosis not present

## 2020-07-10 DIAGNOSIS — Z1331 Encounter for screening for depression: Secondary | ICD-10-CM | POA: Diagnosis not present

## 2020-07-10 DIAGNOSIS — Z131 Encounter for screening for diabetes mellitus: Secondary | ICD-10-CM | POA: Diagnosis not present

## 2020-07-10 DIAGNOSIS — I10 Essential (primary) hypertension: Secondary | ICD-10-CM | POA: Diagnosis not present

## 2020-07-10 DIAGNOSIS — J309 Allergic rhinitis, unspecified: Secondary | ICD-10-CM | POA: Diagnosis not present

## 2020-07-10 DIAGNOSIS — Z0000 Encounter for general adult medical examination without abnormal findings: Secondary | ICD-10-CM | POA: Diagnosis not present

## 2020-07-10 DIAGNOSIS — F411 Generalized anxiety disorder: Secondary | ICD-10-CM | POA: Diagnosis not present

## 2020-07-10 DIAGNOSIS — Z13220 Encounter for screening for lipoid disorders: Secondary | ICD-10-CM | POA: Diagnosis not present

## 2020-07-15 DIAGNOSIS — M545 Low back pain, unspecified: Secondary | ICD-10-CM | POA: Diagnosis not present

## 2020-07-15 DIAGNOSIS — S39012A Strain of muscle, fascia and tendon of lower back, initial encounter: Secondary | ICD-10-CM | POA: Diagnosis not present

## 2020-09-03 DIAGNOSIS — Z5181 Encounter for therapeutic drug level monitoring: Secondary | ICD-10-CM | POA: Diagnosis not present

## 2020-09-03 DIAGNOSIS — Z79891 Long term (current) use of opiate analgesic: Secondary | ICD-10-CM | POA: Diagnosis not present

## 2020-12-03 DIAGNOSIS — G894 Chronic pain syndrome: Secondary | ICD-10-CM | POA: Diagnosis not present

## 2020-12-03 DIAGNOSIS — M5416 Radiculopathy, lumbar region: Secondary | ICD-10-CM | POA: Diagnosis not present

## 2020-12-03 DIAGNOSIS — M519 Unspecified thoracic, thoracolumbar and lumbosacral intervertebral disc disorder: Secondary | ICD-10-CM | POA: Diagnosis not present

## 2020-12-03 DIAGNOSIS — Z79899 Other long term (current) drug therapy: Secondary | ICD-10-CM | POA: Diagnosis not present

## 2021-01-28 ENCOUNTER — Other Ambulatory Visit: Payer: Self-pay | Admitting: Student

## 2021-01-28 DIAGNOSIS — Z1231 Encounter for screening mammogram for malignant neoplasm of breast: Secondary | ICD-10-CM

## 2021-02-26 DIAGNOSIS — Z79899 Other long term (current) drug therapy: Secondary | ICD-10-CM | POA: Diagnosis not present

## 2021-02-26 DIAGNOSIS — J069 Acute upper respiratory infection, unspecified: Secondary | ICD-10-CM | POA: Diagnosis not present

## 2021-02-26 DIAGNOSIS — G894 Chronic pain syndrome: Secondary | ICD-10-CM | POA: Diagnosis not present

## 2021-02-26 DIAGNOSIS — M519 Unspecified thoracic, thoracolumbar and lumbosacral intervertebral disc disorder: Secondary | ICD-10-CM | POA: Diagnosis not present

## 2021-02-26 DIAGNOSIS — M5416 Radiculopathy, lumbar region: Secondary | ICD-10-CM | POA: Diagnosis not present

## 2021-04-09 ENCOUNTER — Ambulatory Visit: Payer: BC Managed Care – PPO | Admitting: Advanced Practice Midwife

## 2021-04-17 ENCOUNTER — Ambulatory Visit: Payer: BC Managed Care – PPO | Admitting: Licensed Practical Nurse

## 2021-05-08 ENCOUNTER — Ambulatory Visit: Payer: BC Managed Care – PPO | Admitting: Family Medicine

## 2021-06-15 ENCOUNTER — Ambulatory Visit (INDEPENDENT_AMBULATORY_CARE_PROVIDER_SITE_OTHER): Payer: BC Managed Care – PPO | Admitting: Family Medicine

## 2021-06-15 ENCOUNTER — Encounter: Payer: Self-pay | Admitting: Family Medicine

## 2021-06-15 VITALS — BP 139/80 | Ht 66.0 in | Wt 186.0 lb

## 2021-06-15 DIAGNOSIS — K5901 Slow transit constipation: Secondary | ICD-10-CM | POA: Diagnosis not present

## 2021-06-15 DIAGNOSIS — N898 Other specified noninflammatory disorders of vagina: Secondary | ICD-10-CM

## 2021-06-15 DIAGNOSIS — Z01411 Encounter for gynecological examination (general) (routine) with abnormal findings: Secondary | ICD-10-CM

## 2021-06-15 DIAGNOSIS — Z01419 Encounter for gynecological examination (general) (routine) without abnormal findings: Secondary | ICD-10-CM

## 2021-06-15 DIAGNOSIS — Z90710 Acquired absence of both cervix and uterus: Secondary | ICD-10-CM

## 2021-06-15 DIAGNOSIS — N393 Stress incontinence (female) (male): Secondary | ICD-10-CM | POA: Diagnosis not present

## 2021-06-15 DIAGNOSIS — N8110 Cystocele, unspecified: Secondary | ICD-10-CM

## 2021-06-15 DIAGNOSIS — N951 Menopausal and female climacteric states: Secondary | ICD-10-CM | POA: Diagnosis not present

## 2021-06-15 NOTE — Progress Notes (Signed)
GYNECOLOGY ANNUAL PREVENTATIVE CARE ENCOUNTER NOTE  Subjective:   Brenda Park is a 46 y.o. [phone] female here for a routine annual gynecologic exam.  Current complaints: Vaginal dryness.     S/p TAH in 2006 Vaginal cuff pap in 2020- NIL  Reports leaking with coughing/sneezing, she feels increased pelvic pressure and bladder "dropping"  Has constipation, using stool softeners regularly.   Has active order for CRC screening and Mammogram from PCP  Denies abnormal vaginal bleeding, discharge, pelvic pain, problems with intercourse or other gynecologic concerns.    Gynecologic History No LMP recorded. Patient has had a hysterectomy. Contraception: status post hysterectomy Last Pap: 2020. Results were: normal Last mammogram: needed-- patient had order from PCP  Health Maintenance Due  Topic Date Due   HIV Screening  Never done   Hepatitis C Screening  Never done   COLONOSCOPY (Pts 45-24yrs Insurance coverage will need to be confirmed)  Never done   COVID-19 Vaccine (3 - Moderna series) 08/21/2019    The following portions of the patient's history were reviewed and updated as appropriate: allergies, current medications, past family history, past medical history, past social history, past surgical history and problem list.  Review of Systems Pertinent items are noted in HPI.   Objective:  BP 139/80   Ht 5\' 6"  (1.676 m)   Wt 186 lb (84.4 kg)   BMI 30.02 kg/m  CONSTITUTIONAL: Well-developed, well-nourished female in no acute distress.  HENT:  Normocephalic, atraumatic, External right and left ear normal. Oropharynx is clear and moist EYES:  No scleral icterus.  NECK: Normal range of motion, supple, no masses.  Normal thyroid.  SKIN: Skin is warm and dry. No rash noted. Not diaphoretic. No erythema. No pallor. NEUROLOGIC: Alert and oriented to person, place, and time. Normal reflexes, muscle tone coordination. No cranial nerve deficit noted. PSYCHIATRIC: Normal mood and  affect. Normal behavior. Normal judgment and thought content. CARDIOVASCULAR: Normal heart rate noted, regular rhythm. 2+ distal pulses. RESPIRATORY: Effort and breath sounds normal, no problems with respiration noted. BREASTS: Symmetric in size. No masses, skin changes, nipple drainage, or lymphadenopathy. ABDOMEN: Soft,  no distention noted.  No tenderness, rebound or guarding.  PELVIC: Normal appearing external genitalia; Mildly atrophic vaginal mucosa and cervix. No abnormal discharge noted.    Normal uterine size, no other palpable masses, no uterine or adnexal tenderness. MUSCULOSKELETAL: Normal range of motion.     Assessment and Plan:  1) Annual gynecologic examination:   Routine preventative health maintenance measures emphasized. Reviewed perimenopausal symptoms and management.   1. S/P TAH (total abdominal hysterectomy) Will be unable to determine menopause and discussed role of FSH screening  2. Perimenopausal vasomotor symptoms - FSH today  3. Vaginal dryness - Possible mild yeast infection. Recommended OTC yeast creams and apple cider vinegar bath. Given instruction in AVS Would be candidate for estrogen cream in the future if needed - Follicle stimulating hormone  4. Stress incontinence Reviewed reasons for this and the recommendation for uro gyn evaluation. Declines today  5. Bladder prolapse, female, acquired Recommended referral to uro gyn, patient declined this year.   6. Well woman exam with routine gynecological exam Up to date Pap not needed Aware of need for CRC and breast cancer screening, has active orders from PCP  7. Slow transit constipation Recommended increased fluids/water Reviewed role of probiotics and fiber   There are no diagnoses linked to this encounter.  Please refer to After Visit Summary for other counseling recommendations.   Return  in about 1 year (around 06/16/2022) for Yearly wellness exam.  Federico Flake, MD, MPH,  ABFM Attending Physician [facility]

## 2021-06-15 NOTE — Patient Instructions (Signed)
   Option #1 1 Tbsp Fractitionated Coconut Oil 10 drops of Melaleuca (Tea Tree) Oil  Mix ingredients together well.  Soak 3-4 tampons (in applicators) in that mixture until all or mostly all mixture is soaked up into the tampons.  Insert 1 saturated tampon vaginally and wear overnight for 3-4 nights.    Option #2 (sometimes to be used in conjunction with option #1) Fill tub with enough to cover lap/lower abdomen warm water.  Mix 1/2 cup of baking soda in water.  Soak in water/baking soda mixture for at least 20 minutes.  Be sure to swish water in between legs to get as much in vagina as possible.  This soak should be done after sexual intercourse and menstrual cycles.     Option #3 (sometimes to be used in conjunction with option #1 and 2) Fill tub with enough to cover lap/lower abdomen warm water.  Mix 2-4 cup of apple cider vinegar in water.  Soak in water/vinegar mixture for at least 20 minutes.  Be sure to swish water in between legs to get as much in vagina as possible.  This soak should be done after sexual intercourse and menstrual cycles.    GO WHITE: Soap: UNSCENTED Dove (white box light green writing) Laundry detergent (underwear)- Dreft or [employer] n[employer] Hammer unscented WHITE 100% cotton panties (NOT just cotton crouch) Sanitary napkin/panty liners: UNSCENTED.  If it doesn't SAY unscented it can have a scent/perfume    NO PERFUMES OR LOTIONS OR POTIONS in the vulvar area (may use regular [place]) Condoms: hypoallergenic only. Non dyed (no color) Toilet papers: white only Wash clothes: use a separate wash cloth. WHITE.  Wash in [place].   You can purchase Tea Tree Oil locally at:  [employer] [employer] [address] [place] [address]  [facility] [address] [place] [address]  Advise that these alternatives will not replace the need to be evaluated if symptoms persist. You will need to seek care at an OB/GYN provider.

## 2021-06-16 LAB — FOLLICLE STIMULATING HORMONE: FSH: 6.4 m[IU]/mL

## 2021-07-15 DIAGNOSIS — Z79899 Other long term (current) drug therapy: Secondary | ICD-10-CM | POA: Diagnosis not present

## 2021-07-16 DIAGNOSIS — G894 Chronic pain syndrome: Secondary | ICD-10-CM | POA: Insufficient documentation

## 2021-07-16 DIAGNOSIS — F411 Generalized anxiety disorder: Secondary | ICD-10-CM | POA: Insufficient documentation

## 2021-10-07 ENCOUNTER — Ambulatory Visit
Admission: RE | Admit: 2021-10-07 | Discharge: 2021-10-07 | Disposition: A | Discharge status: Home / Self Care | Payer: BC Managed Care – PPO | Source: Ambulatory Visit | Attending: Student | Admitting: Student

## 2021-10-07 DIAGNOSIS — Z1231 Encounter for screening mammogram for malignant neoplasm of breast: Secondary | ICD-10-CM

## 2021-10-09 ENCOUNTER — Other Ambulatory Visit: Payer: Self-pay | Admitting: Student

## 2021-10-09 DIAGNOSIS — R928 Other abnormal and inconclusive findings on diagnostic imaging of breast: Secondary | ICD-10-CM

## 2021-10-19 ENCOUNTER — Other Ambulatory Visit: Payer: BC Managed Care – PPO

## 2021-10-19 ENCOUNTER — Ambulatory Visit
Admission: RE | Admit: 2021-10-19 | Discharge: 2021-10-19 | Disposition: A | Discharge status: Home / Self Care | Payer: BC Managed Care – PPO | Source: Ambulatory Visit | Attending: Student | Admitting: Student

## 2021-10-19 ENCOUNTER — Other Ambulatory Visit: Payer: Self-pay | Admitting: Student

## 2021-10-19 DIAGNOSIS — N6310 Unspecified lump in the right breast, unspecified quadrant: Secondary | ICD-10-CM

## 2021-10-19 DIAGNOSIS — R928 Other abnormal and inconclusive findings on diagnostic imaging of breast: Secondary | ICD-10-CM

## 2021-10-26 ENCOUNTER — Other Ambulatory Visit: Payer: BC Managed Care – PPO

## 2022-03-25 DIAGNOSIS — M7918 Myalgia, other site: Secondary | ICD-10-CM | POA: Insufficient documentation

## 2022-04-16 ENCOUNTER — Other Ambulatory Visit: Payer: BC Managed Care – PPO

## 2022-05-03 ENCOUNTER — Other Ambulatory Visit: Payer: Self-pay | Admitting: Student

## 2022-05-03 ENCOUNTER — Ambulatory Visit
Admission: RE | Admit: 2022-05-03 | Discharge: 2022-05-03 | Disposition: A | Discharge status: Home / Self Care | Payer: BC Managed Care – PPO | Source: Ambulatory Visit | Attending: Student | Admitting: Student

## 2022-05-03 DIAGNOSIS — N6310 Unspecified lump in the right breast, unspecified quadrant: Secondary | ICD-10-CM

## 2022-08-16 ENCOUNTER — Other Ambulatory Visit: Payer: Self-pay

## 2022-08-16 ENCOUNTER — Telehealth: Payer: Self-pay

## 2022-08-16 DIAGNOSIS — Z1211 Encounter for screening for malignant neoplasm of colon: Secondary | ICD-10-CM

## 2022-08-16 MED ORDER — NA SULFATE-K SULFATE-MG SULF 17.5-3.13-1.6 GM/177ML PO SOLN: 1.0000 | Freq: Once | ORAL | 0 refills | Status: AC

## 2022-08-16 NOTE — Telephone Encounter (Signed)
Gastroenterology Pre-Procedure Review  Request Date: 10/01/22 Requesting Physician: Dr. Tobi Bastos  PATIENT REVIEW QUESTIONS: The patient responded to the following health history questions as indicated:    1. Are you having any GI issues? no 2. Do you have a personal history of Polyps? no 3. Do you have a family history of Colon Cancer or Polyps? no 4. Diabetes Mellitus? no 5. Joint replacements in the past 12 months?no 6. Major health problems in the past 3 months?no 7. Any artificial heart valves, MVP, or defibrillator?no    MEDICATIONS & ALLERGIES:    Patient reports the following regarding taking any anticoagulation/antiplatelet therapy:   Plavix, Coumadin, Eliquis, Xarelto, Lovenox, Pradaxa, Brilinta, or Effient? no Aspirin? no  Patient confirms/reports the following medications:  Current Outpatient Medications  Medication Sig Dispense Refill   losartan (COZAAR) 100 MG tablet      methocarbamol (ROBAXIN) 750 MG tablet Take 1 tablet 3 times a day by oral route for 30 days.     propranolol (INDERAL) 20 MG tablet TAKE 1 TABLET(20 MG) BY MOUTH TWICE DAILY AS NEEDED     traMADol (ULTRAM) 50 MG tablet Take by mouth.     venlafaxine XR (EFFEXOR-XR) 37.5 MG 24 hr capsule Take by mouth.     venlafaxine XR (EFFEXOR-XR) 75 MG 24 hr capsule Take 75 mg by mouth daily.     busPIRone (BUSPAR) 15 MG tablet  (Patient not taking: Reported on 08/16/2022)     busPIRone (BUSPAR) 7.5 MG tablet  (Patient not taking: Reported on 08/16/2022)     meloxicam (MOBIC) 15 MG tablet TK 1 T PO QD (Patient not taking: Reported on 06/15/2021)     No current facility-administered medications for this visit.    Patient confirms/reports the following allergies:  No Known Allergies  No orders of the defined types were placed in this encounter.   AUTHORIZATION INFORMATION Primary Insurance: 1D#: Group #:  Secondary Insurance: 1D#: Group #:  SCHEDULE INFORMATION: Date: 10/01/22 Time: Location: [facility]

## 2022-09-13 ENCOUNTER — Encounter: Payer: Self-pay | Admitting: Student

## 2022-09-24 ENCOUNTER — Encounter: Payer: Self-pay | Admitting: Gastroenterology

## 2022-09-27 ENCOUNTER — Telehealth: Payer: Self-pay | Admitting: Gastroenterology

## 2022-09-27 MED ORDER — NA SULFATE-K SULFATE-MG SULF 17.5-3.13-1.6 GM/177ML PO SOLN: 1.0000 | Freq: Once | ORAL | 0 refills | Status: AC

## 2022-09-27 NOTE — Telephone Encounter (Signed)
After being placing on hold for 20 minutes, I have decided to just resent Rx of Suprep to [facility].  Patient was notified of this.  Patient verbalized understanding.

## 2022-09-27 NOTE — Telephone Encounter (Signed)
Patient called in because she needs her prep to be recalled in to her pharmacy at [address], [place] [address].

## 2022-09-27 NOTE — Addendum Note (Signed)
Addended by: Tawnya Crook on: 09/27/2022 11:24 AM   Modules accepted: Orders

## 2022-09-30 ENCOUNTER — Encounter: Payer: Self-pay | Admitting: Gastroenterology

## 2022-10-01 ENCOUNTER — Encounter: Admission: RE | Payer: Self-pay | Source: Home / Self Care

## 2022-10-01 ENCOUNTER — Ambulatory Visit
Admission: RE | Admit: 2022-10-01 | Payer: BC Managed Care – PPO | Source: Home / Self Care | Admitting: Gastroenterology

## 2022-10-01 SURGERY — COLONOSCOPY WITH PROPOFOL
Anesthesia: General

## 2023-02-17 ENCOUNTER — Other Ambulatory Visit: Payer: BC Managed Care – PPO

## 2023-02-24 ENCOUNTER — Other Ambulatory Visit: Payer: BC Managed Care – PPO

## 2023-03-01 ENCOUNTER — Other Ambulatory Visit: Payer: BC Managed Care – PPO

## 2023-03-14 ENCOUNTER — Ambulatory Visit
Admission: RE | Admit: 2023-03-14 | Discharge: 2023-03-14 | Disposition: A | Discharge status: Home / Self Care | Payer: BC Managed Care – PPO | Source: Ambulatory Visit | Attending: Student | Admitting: Student

## 2023-03-14 DIAGNOSIS — N6310 Unspecified lump in the right breast, unspecified quadrant: Secondary | ICD-10-CM
